# Patient Record
Sex: Male | Born: 1995 | Race: White | Hispanic: No | Marital: Single | Smoking: Never smoker
Health system: Southern US, Community
[De-identification: ages and names within clinical notes are randomized; demographics above are authoritative.]

## PROBLEM LIST (undated history)

## (undated) DIAGNOSIS — E8881 Metabolic syndrome: Secondary | ICD-10-CM

## (undated) DIAGNOSIS — E669 Obesity, unspecified: Secondary | ICD-10-CM

## (undated) DIAGNOSIS — F329 Major depressive disorder, single episode, unspecified: Secondary | ICD-10-CM

## (undated) DIAGNOSIS — F32A Depression, unspecified: Secondary | ICD-10-CM

## (undated) DIAGNOSIS — J45909 Unspecified asthma, uncomplicated: Secondary | ICD-10-CM

## (undated) DIAGNOSIS — H6593 Unspecified nonsuppurative otitis media, bilateral: Secondary | ICD-10-CM

## (undated) DIAGNOSIS — K219 Gastro-esophageal reflux disease without esophagitis: Secondary | ICD-10-CM

## (undated) DIAGNOSIS — L0591 Pilonidal cyst without abscess: Secondary | ICD-10-CM

## (undated) DIAGNOSIS — K760 Fatty (change of) liver, not elsewhere classified: Secondary | ICD-10-CM

## (undated) HISTORY — DX: Obesity, unspecified: E66.9

## (undated) HISTORY — DX: Major depressive disorder, single episode, unspecified: F32.9

## (undated) HISTORY — PX: MIDDLE EAR SURGERY: SHX713

## (undated) HISTORY — DX: Depression, unspecified: F32.A

## (undated) HISTORY — DX: Unspecified nonsuppurative otitis media, bilateral: H65.93

## (undated) HISTORY — DX: Gastro-esophageal reflux disease without esophagitis: K21.9

## (undated) HISTORY — PX: TYMPANOSTOMY TUBE PLACEMENT: SHX32

## (undated) HISTORY — DX: Pilonidal cyst without abscess: L05.91

## (undated) HISTORY — PX: TONSILECTOMY, ADENOIDECTOMY, BILATERAL MYRINGOTOMY AND TUBES: SHX2538

---
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
## 2017-09-01 ENCOUNTER — Encounter: Payer: Self-pay | Admitting: Emergency Medicine

## 2017-09-01 ENCOUNTER — Other Ambulatory Visit: Payer: Self-pay

## 2017-09-01 ENCOUNTER — Emergency Department
Admission: EM | Admit: 2017-09-01 | Discharge: 2017-09-01 | Disposition: A | Discharge status: Home / Self Care | Payer: Medicaid Other | Attending: Emergency Medicine | Admitting: Emergency Medicine

## 2017-09-01 DIAGNOSIS — H6690 Otitis media, unspecified, unspecified ear: Secondary | ICD-10-CM | POA: Insufficient documentation

## 2017-09-01 DIAGNOSIS — Z79899 Other long term (current) drug therapy: Secondary | ICD-10-CM | POA: Insufficient documentation

## 2017-09-01 DIAGNOSIS — J45909 Unspecified asthma, uncomplicated: Secondary | ICD-10-CM | POA: Diagnosis not present

## 2017-09-01 DIAGNOSIS — H9202 Otalgia, left ear: Secondary | ICD-10-CM | POA: Diagnosis present

## 2017-09-01 MED ORDER — AMOXICILLIN-POT CLAVULANATE 875-125 MG PO TABS: 1.0000 | ORAL_TABLET | Freq: Two times a day (BID) | ORAL | 0 refills | Status: AC

## 2017-09-01 MED ORDER — AMOXICILLIN-POT CLAVULANATE 875-125 MG PO TABS
1.0000 | ORAL_TABLET | Freq: Once | ORAL | Status: AC
  Administered 2017-09-01: 1 via ORAL
  Filled 2017-09-01: qty 1

## 2017-09-01 NOTE — ED Notes (Signed)
See triage note  Here for left ear pain and he also noticed some drainage from ear

## 2017-09-01 NOTE — ED Triage Notes (Addendum)
Pt states he has noticed left ear pain and bleeding in the ear since yesterday. Pt states he feels like his ears are full of fluid as well.   Pt states since birth he has had chronic otitis media. Pt states he has tubes in ears and is afraid one fell out.  Pt has ENT- [place]. [place].

## 2017-09-01 NOTE — ED Provider Notes (Signed)
[facility] Emergency Department Provider Note  ____________________________________________  Time seen: Approximately 2:59 PM  I have reviewed the triage vital signs and the nursing notes.   HISTORY  Chief Complaint Otalgia    HPI Daniel Freeman is a 22 y.o. male the presents emergency department for evaluation of left ear pain since yesterday with bloody drainage since this morning.  It feels like he has an ear infection.  No trauma. Patient states that he has had chronic bilateral otitis media since he was born.  He has had tubes in his ear since he was born.  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  He sees Dr. Elenore RotaJuengel.  No fever, chills.   Past Medical History:  Diagnosis Date  . Asthma   . Bilateral nonsuppurative otitis media   . Depression   . GERD (gastroesophageal reflux disease)   . Obesity   . Pilonidal cyst 2015    Patient Active Problem List   Diagnosis Date Noted  . Healthcare maintenance 06/13/2016  . Low HDL (under 40) 03/31/2016  . Metabolic syndrome 03/31/2016  . Fatty liver 09/10/2015  . Perennial allergic rhinitis 09/10/2015  . Seborrhea capitis 11/18/2014  . Recurrent otitis media of both ears 09/10/2014  . Gastroesophageal reflux disease without esophagitis 09/10/2014  . Childhood obesity 09/08/2009    Past Surgical History:  Procedure Laterality Date  . MIDDLE EAR SURGERY     tubes in ears x 5  . TONSILECTOMY, ADENOIDECTOMY, BILATERAL MYRINGOTOMY AND TUBES    . TYMPANOSTOMY TUBE PLACEMENT      Prior to Admission medications   Medication Sig Start Date End Date Taking? Authorizing Provider  amoxicillin-clavulanate (AUGMENTIN) 875-125 MG tablet Take 1 tablet by mouth 2 (two) times daily for 10 days. 09/01/17 09/11/17  Enid DerryWagner, Allisha Harter, PA-C  citalopram (CELEXA) 20 MG tablet Take 1 tablet (20 mg total) by mouth daily. 10/02/16   Sowles, Danna HeftyKrichna, MD  fluticasone (FLONASE) 50 MCG/ACT nasal spray PLACE 2 SPRAYS INTO BOTH  NOSTRILS DAILY. 10/02/16   Alba CorySowles, Krichna, MD  ranitidine (ZANTAC) 150 MG tablet Take 1 tablet (150 mg total) by mouth 2 (two) times daily. 10/02/16   Alba CorySowles, Krichna, MD  Selenium Sulfide 2.25 % SHAM Apply 1 g topically 3 (three) times a week. 03/31/16   Alba CorySowles, Krichna, MD    Allergies Vyvanse [lisdexamfetamine dimesylate]  Family History  Problem Relation Age of Onset  . Asthma Mother   . Hyperlipidemia Mother   . Hypertension Mother   . Diabetes Mother   . Obesity Mother   . Asthma Sister   . Speech disorder Sister   . Eczema Sister   . Asthma Brother   . Asthma Sister     Social History Social History   Tobacco Use  . Smoking status: Never Smoker  . Smokeless tobacco: Never Used  Substance Use Topics  . Alcohol use: No    Alcohol/week: 0.0 standard drinks  . Drug use: No     Review of Systems  Constitutional: No fever/chills Cardiovascular: No chest pain. Respiratory: No SOB. Gastrointestinal: No nausea, no vomiting.  Musculoskeletal: Negative for musculoskeletal pain. Skin: Negative for rash, abrasions, lacerations, ecchymosis. Neurological: Negative for numbness or tingling   ____________________________________________   PHYSICAL EXAM:  VITAL SIGNS: ED Triage Vitals  Enc Vitals Group     BP 09/01/17 1219 (!) 152/87     Pulse Rate 09/01/17 1219 95     Resp 09/01/17 1219 18     Temp 09/01/17 1219 98.1  F (36.7 C)     Temp Source 09/01/17 1219 Oral     SpO2 09/01/17 1219 97 %     Weight 09/01/17 1219 290 lb (131.5 kg)     Height 09/01/17 1219 5\' 7"  (1.702 m)     Head Circumference --      Peak Flow --      Pain Score 09/01/17 1220 7     Pain Loc --      Pain Edu? --      Excl. in GC? --      Constitutional: Alert and oriented. Well appearing and in no acute distress. Eyes: Conjunctivae are normal. PERRL. EOMI. Head: Atraumatic. ENT:      Ears: Right tympanic membrane is pearly.  Left tympanic membrane is erythematous.  Bloody drainage in  left ear canal.      Nose: No congestion/rhinnorhea.      Mouth/Throat: Mucous membranes are moist.  Neck: No stridor.   Cardiovascular: Normal rate, regular rhythm.  Good peripheral circulation. Respiratory: Normal respiratory effort without tachypnea or retractions. Lungs CTAB. Good air entry to the bases with no decreased or absent breath sounds. Musculoskeletal: Full range of motion to all extremities. No gross deformities appreciated. Neurologic:  Normal speech and language. No gross focal neurologic deficits are appreciated.  Skin:  Skin is warm, dry and intact. No rash noted. Psychiatric: Mood and affect are normal. Speech and behavior are normal. Patient exhibits appropriate insight and judgement.   ____________________________________________   LABS (all labs ordered are listed, but only abnormal results are displayed)  Labs Reviewed - No data to display ____________________________________________  EKG   ____________________________________________  RADIOLOGY   No results found.  ____________________________________________    PROCEDURES  Procedure(s) performed:    Procedures    Medications  amoxicillin-clavulanate (AUGMENTIN) 875-125 MG per tablet 1 tablet (1 tablet Oral Given 09/01/17 1316)     ____________________________________________   INITIAL IMPRESSION / ASSESSMENT AND PLAN / ED COURSE  Pertinent labs & imaging results that were available during my care of the patient were reviewed by me and considered in my medical decision making (see chart for details).  Review of the [place] CSRS was performed in accordance of the [facility] prior to dispensing any controlled drugs.     Patient's diagnosis is consistent with otitis media.There is blood in the ear canal. I do not visualize a tympanic membrane rupture. Patient will be discharged home with prescriptions for Augmentin. Patient is to follow up with ENT next week. Patient is given ED precautions to  return to the ED for any worsening or new symptoms.     ____________________________________________  FINAL CLINICAL IMPRESSION(S) / ED DIAGNOSES  Final diagnoses:  Acute otitis media, unspecified otitis media type      NEW MEDICATIONS STARTED DURING THIS VISIT:  ED Discharge Orders         Ordered    amoxicillin-clavulanate (AUGMENTIN) 875-125 MG tablet  2 times daily     09/01/17 1304              This chart was dictated using voice recognition software/Dragon. Despite best efforts to proofread, errors can occur which can change the meaning. Any change was purely unintentional.    Enid Derry, PA-C 09/01/17 1534    Arnaldo Natal, MD 09/01/17 1630

## 2017-09-03 ENCOUNTER — Telehealth: Payer: Self-pay

## 2017-09-03 NOTE — Telephone Encounter (Signed)
Copied from CRM [id number]. Topic: Referral - Request >> Sep 03, 2017  3:01 PM Rica KoyanagiWeikart, Barbee CoughMelissa J wrote: Reason for CRM: pt needs to have referral to Dr [place]ore [place]Juengel [place]. Pt was taken to ed on 09/01/17 Pt has appt this thurs

## 2017-09-04 ENCOUNTER — Ambulatory Visit (INDEPENDENT_AMBULATORY_CARE_PROVIDER_SITE_OTHER): Payer: Medicaid Other | Admitting: Family Medicine

## 2017-09-04 ENCOUNTER — Encounter: Payer: Self-pay | Admitting: Family Medicine

## 2017-09-04 VITALS — BP 138/76 | HR 96 | Temp 98.3°F | Resp 16 | Ht 65.0 in | Wt 291.3 lb

## 2017-09-04 DIAGNOSIS — E786 Lipoprotein deficiency: Secondary | ICD-10-CM

## 2017-09-04 DIAGNOSIS — E8881 Metabolic syndrome: Secondary | ICD-10-CM

## 2017-09-04 DIAGNOSIS — K760 Fatty (change of) liver, not elsewhere classified: Secondary | ICD-10-CM

## 2017-09-04 DIAGNOSIS — F411 Generalized anxiety disorder: Secondary | ICD-10-CM

## 2017-09-04 DIAGNOSIS — H66005 Acute suppurative otitis media without spontaneous rupture of ear drum, recurrent, left ear: Secondary | ICD-10-CM

## 2017-09-04 DIAGNOSIS — K219 Gastro-esophageal reflux disease without esophagitis: Secondary | ICD-10-CM

## 2017-09-04 DIAGNOSIS — L210 Seborrhea capitis: Secondary | ICD-10-CM

## 2017-09-04 MED ORDER — RANITIDINE HCL 150 MG PO TABS: 150.0000 mg | ORAL_TABLET | Freq: Two times a day (BID) | ORAL | 2 refills | Status: DC

## 2017-09-04 MED ORDER — SELENIUM SULFIDE 2.25 % EX SHAM: 1.0000 g | MEDICATED_SHAMPOO | CUTANEOUS | 2 refills | Status: DC

## 2017-09-04 NOTE — Progress Notes (Signed)
Name: Daniel Freeman   MRN: [id number]    DOB: 06/14/1995   Date:09/04/2017       Progress Note  Subjective  Chief Complaint  Chief Complaint  Patient presents with  . Otitis Media    left ear pain. patient went to the ER over the weekend and was given ATB  . Referral    [facility] ([place] [facility] [place])    HPI  OM: he has history of recurrent OM and had severe symptoms over this past weekend. He sates woke up with blood all over his pillow [facility] He states that pain and drainage has decreased since started on Augmentin. Pain at this time on left ear is dull and 4/10 now.   Morbid obesity: he has been more active, working part time at [employer] as a lot associated. 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   Metabolic syndrome: last hgbA1C was normal. Denies polyphagia, polydipsia or polyuria.   GAD: he states he is doing much better now, off medication, states only took a few pills last year.   Dyslipidemia: low HDL. He states he has been more active, but his diet is slightly better, drinking mostly water and sometimes Gatorade, he still eats fast food when at work   Patient Active Problem List   Diagnosis Date Noted  . Morbid obesity due to excess calories (HCC) 09/04/2017  . Healthcare maintenance 06/13/2016  . Low HDL (under 40) 03/31/2016  . Metabolic syndrome 03/31/2016  . Fatty liver 09/10/2015  . Perennial allergic rhinitis 09/10/2015  . Seborrhea capitis 11/18/2014  . Recurrent otitis media of both ears 09/10/2014  . Gastroesophageal reflux disease without esophagitis 09/10/2014  . Childhood obesity 09/08/2009    Past Surgical History:  Procedure Laterality Date  . MIDDLE EAR SURGERY     tubes in ears x 5  . TONSILECTOMY, ADENOIDECTOMY, BILATERAL MYRINGOTOMY AND TUBES    . TYMPANOSTOMY TUBE PLACEMENT      Family History  Problem Relation Age of Onset  . Asthma Mother   . Hyperlipidemia Mother   . Hypertension Mother   . Diabetes Mother   .  Obesity Mother   . Asthma Sister   . Speech disorder Sister   . Eczema Sister   . Asthma Brother   . Asthma Sister     Social History   Socioeconomic History  . Marital status: Single    Spouse name: Not on file  . Number of children: 0  . Years of education: Not on file  . Highest education level: 11th grade  Occupational History  . Not on file  Social Needs  . Financial resource strain: Somewhat hard  . Food insecurity:    Worry: Patient refused    Inability: Patient refused  . Transportation needs:    Medical: No    Non-medical: No  Tobacco Use  . Smoking status: Never Smoker  . Smokeless tobacco: Never Used  Substance and Sexual Activity  . Alcohol use: No    Alcohol/week: 0.0 standard drinks  . Drug use: No  . Sexual activity: Not Currently  Lifestyle  . Physical activity:    Days per week: 4 days    Minutes per session: 150+ min  . Stress: Only a little  Relationships  . Social connections:    Talks on phone: More than three times a week    Gets together: Twice a week    Attends religious service: Never    Active member  of club or organization: No    Attends meetings of clubs or organizations: Never    Relationship status: Never married  . Intimate partner violence:    Fear of current or ex partner: No    Emotionally abused: No    Physically abused: No    Forced sexual activity: No  Other Topics Concern  . Not on file  Social History Narrative  . Not on file     Current Outpatient Medications:  .  amoxicillin-clavulanate (AUGMENTIN) 875-125 MG tablet, Take 1 tablet by mouth 2 (two) times daily for 10 days., Disp: 20 tablet, Rfl: 0 .  fluticasone (FLONASE) 50 MCG/ACT nasal spray, PLACE 2 SPRAYS INTO BOTH NOSTRILS DAILY., Disp: 16 g, Rfl: 2 .  ranitidine (ZANTAC) 150 MG tablet, Take 1 tablet (150 mg total) by mouth 2 (two) times daily., Disp: 60 tablet, Rfl: 2 .  [START ON 09/05/2017] Selenium Sulfide 2.25 % SHAM, Apply 1 g topically 3 (three) times a  week., Disp: 180 mL, Rfl: 2  Allergies  Allergen Reactions  . Vyvanse [Lisdexamfetamine Dimesylate] Other (See Comments)    Emotional liability     ROS  Constitutional: Negative for fever or weight change.  Respiratory: Negative for cough and shortness of breath.   Cardiovascular: Negative for chest pain or palpitations.  Gastrointestinal: Negative for abdominal pain, no bowel changes.  Musculoskeletal: Negative for gait problem or joint swelling.  Skin: Negative for rash.  Neurological: Negative for dizziness or headache.  No other specific complaints in a complete review of systems (except as listed in HPI above).  Objective  Vitals:   09/04/17 1004  BP: 138/76  Pulse: 96  Resp: 16  Temp: 98.3 F (36.8 C)  TempSrc: Oral  SpO2: 97%  Weight: 291 lb 4.8 oz (132.1 kg)  Height: 5\' 5"  (1.651 m)    Body mass index is 48.47 kg/m.  Physical Exam  Constitutional: Patient appears well-developed and well-nourished. Obese  No distress.  HEENT: head atraumatic, normocephalic, pupils equal and reactive to light, ears right side has cerumen, left side has some drainage, tube in place , air fluid levels,  neck supple, throat within normal limits Cardiovascular: Normal rate, regular rhythm and normal heart sounds.  No murmur heard. No BLE edema. Pulmonary/Chest: Effort normal and breath sounds normal. No respiratory distress. Abdominal: Soft.  There is no tenderness. Psychiatric: Patient has a normal mood and affect. behavior is normal. Judgment and thought content normal.  PHQ2/9: Depression screen [facility] 2/9 09/04/2017 10/02/2016 03/31/2016 09/10/2015 03/25/2015  Decreased Interest 0 0 0 0 0  Down, Depressed, Hopeless 0 0 - 0 0  PHQ - 2 Score 0 0 0 0 0  Altered sleeping 0 - - - -  Tired, decreased energy 1 - - - -  Change in appetite 1 - - - -  Feeling bad or failure about yourself  0 - - - -  Trouble concentrating 0 - - - -  Moving slowly or fidgety/restless 0 - - - -  Suicidal  thoughts 0 - - - -  PHQ-9 Score 2 - - - -  Difficult doing work/chores Not difficult at all - - - -     Fall Risk: Fall Risk  09/04/2017 10/02/2016 03/31/2016 09/10/2015 03/25/2015  Falls in the past year? No No No No No    Functional Status Survey: Is the patient deaf or have difficulty hearing?: Yes Does the patient have difficulty seeing, even when wearing glasses/contacts?: No Does the patient have  difficulty concentrating, remembering, or making decisions?: No Does the patient have difficulty walking or climbing stairs?: No Does the patient have difficulty dressing or bathing?: No Does the patient have difficulty doing errands alone such as visiting a doctor's office or shopping?: No    Assessment & Plan  1. Recurrent acute suppurative otitis media of left ear  - Ambulatory referral to ENT  2. Gastroesophageal reflux disease without esophagitis  - ranitidine (ZANTAC) 150 MG tablet; Take 1 tablet (150 mg total) by mouth 2 (two) times daily.  Dispense: 60 tablet; Refill: 2  3. Morbid obesity due to excess calories [facility](HCC)  Discussed with the patient the risk posed by an increased BMI. Discussed importance of portion control, calorie counting and at least 150 minutes of physical activity weekly. Avoid sweet beverages and drink more water. Eat at least 6 servings of fruit and vegetables daily   4. Metabolic syndrome  -hgbA1C  5. GAD (generalized anxiety disorder)  Doing well at this time, stopped medication on his own   6. Fatty liver  -comp panel   7. Low HDL (under 40)  -lipid panel   8. Seborrhea capitis  - Selenium Sulfide 2.25 % SHAM; Apply 1 g topically 3 (three) times a week.  Dispense: 180 mL; Refill: 2

## 2017-09-04 NOTE — Telephone Encounter (Signed)
He needs to be seen, last visit 08/2017 Needs labs and OM with referral to be placed

## 2017-10-04 NOTE — Discharge Instructions (Signed)
[facility] °[facility] °ENDOSCOPIC SINUS SURGERY °[facility] ° °What is Functional Endoscopic Sinus Surgery? ° The Surgery involves making the natural openings of the sinuses larger by removing the bony partitions that separate the sinuses from the nasal cavity.  The natural sinus lining is preserved as much as possible to allow the sinuses to resume normal function after the surgery.  In some patients nasal polyps (excessively swollen lining of the sinuses) may be removed to relieve obstruction of the sinus openings.  The surgery is performed through the nose using lighted scopes, which eliminates the need for incisions on the face.  A septoplasty is a different procedure which is sometimes performed with sinus surgery.  It involves straightening the boy partition that separates the two sides of your nose.  A crooked or deviated septum may need repair if is obstructing the sinuses or nasal airflow.  Turbinate reduction is also often performed during sinus surgery.  The turbinates are bony proturberances from the side walls of the nose which swell and can obstruct the nose in patients with sinus and allergy problems.  Their size can be surgically reduced to help relieve nasal obstruction. ° °What Can Sinus Surgery Do For Me? ° Sinus surgery can reduce the frequency of sinus infections requiring antibiotic treatment.  This can provide improvement in nasal congestion, post-nasal drainage, facial pressure and nasal obstruction.  Surgery will NOT prevent you from ever having an infection again, so it usually only for patients who get infections 4 or more times yearly requiring antibiotics, or for infections that do not clear with antibiotics.  It will not cure nasal allergies, so patients with allergies may still require medication to treat their allergies after surgery. Surgery may improve headaches related to sinusitis, however, some people will continue to  require medication to control sinus headaches related to allergies.  Surgery will do nothing for other forms of headache (migraine, tension or cluster). ° °What Are the Risks of Endoscopic Sinus Surgery? ° Current techniques allow surgery to be performed safely with little risk, however, there are rare complications that patients should be aware of.  Because the sinuses are located around the eyes, there is risk of eye injury, including blindness, though again, this would be quite rare. This is usually a result of bleeding behind the eye during surgery, which puts the vision oat risk, though there are treatments to protect the vision and prevent permanent disrupted by surgery causing a leak of the spinal fluid that surrounds the brain.  More serious complications would include bleeding inside the brain cavity or damage to the brain.  Again, all of these complications are uncommon, and spinal fluid leaks can be safely managed surgically if they occur.  The most common complication of sinus surgery is bleeding from the nose, which may require packing or cauterization of the nose.  Continued sinus have polyps may experience recurrence of the polyps requiring revision surgery.  Alterations of sense of smell or injury to the tear ducts are also rare complications.  ° °What is the Surgery Like, and what is the Recovery? ° The Surgery usually takes a couple of hours to perform, and is usually performed under a general anesthetic (completely asleep).  Patients are usually discharged home after a couple of hours.  Sometimes during surgery it is necessary to pack the nose to control bleeding, and the packing is left in place for 24 - 48 hours, and removed by your surgeon.    If a septoplasty was performed during the procedure, there is often a splint placed which must be removed after 5-7 days.   °Discomfort: Pain is usually mild to moderate, and can be controlled by prescription pain medication or acetaminophen (Tylenol).   Aspirin, Ibuprofen (Advil, Motrin), or Naprosyn (Aleve) should be avoided, as they can cause increased bleeding.  Most patients feel sinus pressure like they have a bad head cold for several days.  Sleeping with your head elevated can help reduce swelling and facial pressure, as can ice packs over the face.  A humidifier may be helpful to keep the mucous and blood from drying in the nose.  ° °Diet: There are no specific diet restrictions, however, you should generally start with clear liquids and a light diet of bland foods because the anesthetic can cause some nausea.  Advance your diet depending on how your stomach feels.  Taking your pain medication with food will often help reduce stomach upset which pain medications can cause. ° °Nasal Saline Irrigation: It is important to remove blood clots and dried mucous from the nose as it is healing.  This is done by having you irrigate the nose at least 3 - 4 times daily with a salt water solution.  We recommend using NeilMed Sinus Rinse (available at the drug store).  Fill the squeeze bottle with the solution, bend over a sink, and insert the tip of the squeeze bottle into the nose ½ of an inch.  Point the tip of the squeeze bottle towards the inside corner of the eye on the same side your irrigating.  Squeeze the bottle and gently irrigate the nose.  If you bend forward as you do this, most of the fluid will flow back out of the nose, instead of down your throat.   The solution should be warm, near body temperature, when you irrigate.   Each time you irrigate, you should use a full squeeze bottle.  ° °Note that if you are instructed to use Nasal Steroid Sprays at any time after your surgery, irrigate with saline BEFORE using the steroid spray, so you do not wash it all out of the nose. °Another product, Nasal Saline Gel (such as AYR Nasal Saline Gel) can be applied in each nostril 3 - 4 times daily to moisture the nose and reduce scabbing or crusting. ° °Bleeding:   Bloody drainage from the nose can be expected for several days, and patients are instructed to irrigate their nose frequently with salt water to help remove mucous and blood clots.  The drainage may be dark red or brown, though some fresh blood may be seen intermittently, especially after irrigation.  Do not blow you nose, as bleeding may occur. If you must sneeze, keep your mouth open to allow air to escape through your mouth. ° °If heavy bleeding occurs: Irrigate the nose with saline to rinse out clots, then spray the nose 3 - 4 times with Afrin Nasal Decongestant Spray.  The spray will constrict the blood vessels to slow bleeding.  Pinch the lower half of your nose shut to apply pressure, and lay down with your head elevated.  Ice packs over the nose may help as well. If bleeding persists despite these measures, you should notify your doctor.  Do not use the Afrin routinely to control nasal congestion after surgery, as it can result in worsening congestion and may affect healing.  ° ° ° °Activity: Return to work varies among patients. Most patients will be   out of work at least 5 - 7 days to recover.  Patient may return to work after they are off of narcotic pain medication, and feeling well enough to perform the functions of their job.  Patients must avoid heavy lifting (over 10 pounds) or strenuous physical for 2 weeks after surgery, so your employer may need to assign you to light duty, or keep you out of work longer if light duty is not possible.  NOTE: you should not drive, operate dangerous machinery, do any mentally demanding tasks or make any important legal or financial decisions while on narcotic pain medication and recovering from the general anesthetic.  °  °Call Your Doctor Immediately if You Have Any of the Following: °1. Bleeding that you cannot control with the above measures °2. Loss of vision, double vision, bulging of the eye or black eyes. °3. Fever over 101 degrees °4. Neck stiffness with  severe headache, fever, nausea and change in mental state. °You are always encourage to call anytime with concerns, however, please call with requests for pain medication refills during office hours. ° °Office Endoscopy: During follow-up visits your doctor will remove any packing or splints that may have been placed and evaluate and clean your sinuses endoscopically.  Topical anesthetic will be used to make this as comfortable as possible, though you may want to take your pain medication prior to the visit.  How often this will need to be done varies from patient to patient.  After complete recovery from the surgery, you may need follow-up endoscopy from time to time, particularly if there is concern of recurrent infection or nasal polyps. ° ° °General Anesthesia, Adult, Care After °These instructions provide you with information about caring for yourself after your procedure. Your health care provider may also give you more specific instructions. Your treatment has been planned according to current medical practices, but problems sometimes occur. Call your health care provider if you have any problems or questions after your procedure. °What can I expect after the procedure? °After the procedure, it is common to have: °· Vomiting. °· A sore throat. °· Mental slowness. ° °It is common to feel: °· Nauseous. °· Cold or shivery. °· Sleepy. °· Tired. °· Sore or achy, even in parts of your body where you did not have surgery. ° °Follow these instructions at home: °For at least 24 hours after the procedure: °· Do not: °? Participate in activities where you could fall or become injured. °? Drive. °? Use heavy machinery. °? Drink alcohol. °? Take sleeping pills or medicines that cause drowsiness. °? Make important decisions or sign legal documents. °? Take care of children on your own. °· Rest. °Eating and drinking °· If you vomit, drink water, juice, or soup when you can drink without vomiting. °· Drink enough fluid to  keep your urine clear or pale yellow. °· Make sure you have little or no nausea before eating solid foods. °· Follow the diet recommended by your health care provider. °General instructions °· Have a responsible adult stay with you until you are awake and alert. °· Return to your normal activities as told by your health care provider. Ask your health care provider what activities are safe for you. °· Take over-the-counter and prescription medicines only as told by your health care provider. °· If you smoke, do not smoke without supervision. °· Keep all follow-up visits as told by your health care provider. This is important. °Contact a health care provider if: °· You   continue to have nausea or vomiting at home, and medicines are not helpful. °· You cannot drink fluids or start eating again. °· You cannot urinate after 8-12 hours. °· You develop a skin rash. °· You have fever. °· You have increasing redness at the site of your procedure. °Get help right away if: °· You have difficulty breathing. °· You have chest pain. °· You have unexpected bleeding. °· You feel that you are having a life-threatening or urgent problem. °This information is not intended to replace advice given to you by your health care provider. Make sure you discuss any questions you have with your health care provider. °Document Released: 04/17/2000 Document Revised: 06/14/2015 Document Reviewed: 12/24/2014 °Elsevier Interactive Patient Education © 2018 [employer] ° °

## 2017-10-11 ENCOUNTER — Encounter
Admission: RE | Disposition: A | Discharge status: Home / Self Care | Payer: Self-pay | Source: Ambulatory Visit | Attending: Otolaryngology

## 2017-10-11 ENCOUNTER — Ambulatory Visit: Payer: Medicaid Other | Admitting: Anesthesiology

## 2017-10-11 ENCOUNTER — Ambulatory Visit
Admission: RE | Admit: 2017-10-11 | Discharge: 2017-10-11 | Disposition: A | Discharge status: Home / Self Care | Payer: Medicaid Other | Source: Ambulatory Visit | Attending: Otolaryngology | Admitting: Otolaryngology

## 2017-10-11 DIAGNOSIS — F329 Major depressive disorder, single episode, unspecified: Secondary | ICD-10-CM | POA: Diagnosis not present

## 2017-10-11 DIAGNOSIS — J45909 Unspecified asthma, uncomplicated: Secondary | ICD-10-CM | POA: Diagnosis not present

## 2017-10-11 DIAGNOSIS — Z79899 Other long term (current) drug therapy: Secondary | ICD-10-CM | POA: Diagnosis not present

## 2017-10-11 DIAGNOSIS — K219 Gastro-esophageal reflux disease without esophagitis: Secondary | ICD-10-CM | POA: Insufficient documentation

## 2017-10-11 DIAGNOSIS — H6610 Chronic tubotympanic suppurative otitis media, unspecified: Secondary | ICD-10-CM | POA: Diagnosis not present

## 2017-10-11 DIAGNOSIS — J320 Chronic maxillary sinusitis: Secondary | ICD-10-CM | POA: Insufficient documentation

## 2017-10-11 DIAGNOSIS — J3489 Other specified disorders of nose and nasal sinuses: Secondary | ICD-10-CM | POA: Insufficient documentation

## 2017-10-11 DIAGNOSIS — H6990 Unspecified Eustachian tube disorder, unspecified ear: Secondary | ICD-10-CM | POA: Diagnosis not present

## 2017-10-11 DIAGNOSIS — J339 Nasal polyp, unspecified: Secondary | ICD-10-CM | POA: Insufficient documentation

## 2017-10-11 HISTORY — DX: Metabolic syndrome: E88.81

## 2017-10-11 HISTORY — PX: MAXILLARY ANTROSTOMY: SHX2003

## 2017-10-11 HISTORY — DX: Metabolic syndrome: E88.810

## 2017-10-11 HISTORY — DX: Fatty (change of) liver, not elsewhere classified: K76.0

## 2017-10-11 SURGERY — MAXILLARY ANTROSTOMY
Anesthesia: General | Site: Nose | Laterality: Bilateral

## 2017-10-11 MED ORDER — OXYMETAZOLINE HCL 0.05 % NA SOLN
2.0000 | Freq: Once | NASAL | Status: AC
  Administered 2017-10-11: 2 via NASAL

## 2017-10-11 MED ORDER — PROPOFOL 10 MG/ML IV BOLUS
INTRAVENOUS | Status: DC | PRN
  Administered 2017-10-11: 170 mg via INTRAVENOUS

## 2017-10-11 MED ORDER — DEXTROSE 5 % IV SOLN
2000.0000 mg | Freq: Once | INTRAVENOUS | Status: AC
  Administered 2017-10-11: 2000 mg via INTRAVENOUS

## 2017-10-11 MED ORDER — GLYCOPYRROLATE 0.2 MG/ML IJ SOLN
INTRAMUSCULAR | Status: DC | PRN
  Administered 2017-10-11: 0.1 mg via INTRAVENOUS

## 2017-10-11 MED ORDER — DEXAMETHASONE SODIUM PHOSPHATE 4 MG/ML IJ SOLN
INTRAMUSCULAR | Status: DC | PRN
  Administered 2017-10-11: 10 mg via INTRAVENOUS

## 2017-10-11 MED ORDER — FENTANYL CITRATE (PF) 100 MCG/2ML IJ SOLN: 25.0000 ug | INTRAMUSCULAR | Status: DC | PRN

## 2017-10-11 MED ORDER — PHENYLEPHRINE HCL 0.5 % NA SOLN
NASAL | Status: DC | PRN
  Administered 2017-10-11: 15 mL via TOPICAL

## 2017-10-11 MED ORDER — ROCURONIUM BROMIDE 100 MG/10ML IV SOLN
INTRAVENOUS | Status: DC | PRN
  Administered 2017-10-11: 25 mg via INTRAVENOUS

## 2017-10-11 MED ORDER — FENTANYL CITRATE (PF) 100 MCG/2ML IJ SOLN
INTRAMUSCULAR | Status: DC | PRN
  Administered 2017-10-11: 100 ug via INTRAVENOUS

## 2017-10-11 MED ORDER — DEXMEDETOMIDINE HCL 200 MCG/2ML IV SOLN
INTRAVENOUS | Status: DC | PRN
  Administered 2017-10-11: 10 ug via INTRAVENOUS

## 2017-10-11 MED ORDER — ACETAMINOPHEN 325 MG PO TABS
975.0000 mg | ORAL_TABLET | Freq: Once | ORAL | Status: AC
  Administered 2017-10-11: 975 mg via ORAL

## 2017-10-11 MED ORDER — OXYCODONE HCL 5 MG/5ML PO SOLN: 5.0000 mg | Freq: Once | ORAL | Status: AC | PRN

## 2017-10-11 MED ORDER — LIDOCAINE HCL (CARDIAC) PF 100 MG/5ML IV SOSY
PREFILLED_SYRINGE | INTRAVENOUS | Status: DC | PRN
  Administered 2017-10-11: 40 mg via INTRAVENOUS

## 2017-10-11 MED ORDER — LACTATED RINGERS IV SOLN
1000.0000 mL | INTRAVENOUS | Status: DC
  Administered 2017-10-11: 13:00:00 via INTRAVENOUS

## 2017-10-11 MED ORDER — OXYCODONE HCL 5 MG PO TABS
5.0000 mg | ORAL_TABLET | Freq: Once | ORAL | Status: AC | PRN
  Administered 2017-10-11: 5 mg via ORAL

## 2017-10-11 MED ORDER — MIDAZOLAM HCL 5 MG/5ML IJ SOLN
INTRAMUSCULAR | Status: DC | PRN
  Administered 2017-10-11: 2 mg via INTRAVENOUS

## 2017-10-11 MED ORDER — LIDOCAINE-EPINEPHRINE 1 %-1:100000 IJ SOLN
INTRAMUSCULAR | Status: DC | PRN
  Administered 2017-10-11: 2.5 mL

## 2017-10-11 MED ORDER — ONDANSETRON HCL 4 MG/2ML IJ SOLN
INTRAMUSCULAR | Status: DC | PRN
  Administered 2017-10-11: 4 mg via INTRAVENOUS

## 2017-10-11 SURGICAL SUPPLY — 27 items
BATTERY INSTRU NAVIGATION (MISCELLANEOUS) ×12 IMPLANT
CANISTER SUCT 1200ML W/VALVE (MISCELLANEOUS) ×4 IMPLANT
CATH IV 18X1 1/4 SAFELET (CATHETERS) ×4 IMPLANT
COAGULATOR SUCT 8FR VV (MISCELLANEOUS) ×4 IMPLANT
DRAPE HEAD BAR (DRAPES) ×4 IMPLANT
ELECT REM PT RETURN 9FT ADLT (ELECTROSURGICAL) ×4
ELECTRODE REM PT RTRN 9FT ADLT (ELECTROSURGICAL) ×2 IMPLANT
GLOVE PI ULTRA LF STRL 7.5 (GLOVE) ×4 IMPLANT
GLOVE PI ULTRA NON LATEX 7.5 (GLOVE) ×4
IV CATH 18X1 1/4 SAFELET (CATHETERS) ×2
IV NS 500ML (IV SOLUTION) ×2
IV NS 500ML BAXH (IV SOLUTION) ×2 IMPLANT
KIT TURNOVER KIT A (KITS) ×4 IMPLANT
NEEDLE ANESTHESIA  27G X 3.5 (NEEDLE) ×2
NEEDLE ANESTHESIA 27G X 3.5 (NEEDLE) ×2 IMPLANT
NS IRRIG 500ML POUR BTL (IV SOLUTION) ×4 IMPLANT
PACK ENT CUSTOM (PACKS) ×4 IMPLANT
PACKING NASAL EPIS 4X2.4 XEROG (MISCELLANEOUS) ×4 IMPLANT
PATTIES SURGICAL .5 X3 (DISPOSABLE) ×4 IMPLANT
SHAVER DIEGO BLD STD TYPE A (BLADE) ×4 IMPLANT
SOL ANTI-FOG 6CC FOG-OUT (MISCELLANEOUS) ×2 IMPLANT
SOL FOG-OUT ANTI-FOG 6CC (MISCELLANEOUS) ×2
SYR 3ML LL SCALE MARK (SYRINGE) ×4 IMPLANT
TOWEL OR 17X26 4PK STRL BLUE (TOWEL DISPOSABLE) ×4 IMPLANT
TRACKER CRANIALMASK (MASK) IMPLANT
TUBING DECLOG MULTIDEBRIDER (TUBING) ×4 IMPLANT
WATER STERILE IRR 250ML POUR (IV SOLUTION) ×4 IMPLANT

## 2017-10-11 NOTE — H&P (Signed)
H&P has been reviewedand patient reevaluated,  and no changes necessary. To be downloaded later.  

## 2017-10-11 NOTE — Anesthesia Procedure Notes (Signed)
Procedure Name: Intubation Date/Time: 10/11/2017 1:02 PM Performed by: Jimmy PicketAmyot, Annalynne Ibanez, CRNA Pre-anesthesia Checklist: Patient identified, Emergency Drugs available, Suction available, Patient being monitored and Timeout performed Patient Re-evaluated:Patient Re-evaluated prior to induction Oxygen Delivery Method: Circle system utilized Preoxygenation: Pre-oxygenation with 100% oxygen Induction Type: IV induction Ventilation: Mask ventilation without difficulty Laryngoscope Size: Miller and 3 Grade View: Grade I Tube type: Oral Rae Tube size: 7.5 mm Number of attempts: 1 Placement Confirmation: ETT inserted through vocal cords under direct vision,  positive ETCO2 and breath sounds checked- equal and bilateral Tube secured with: Tape Dental Injury: Teeth and Oropharynx as per pre-operative assessment

## 2017-10-11 NOTE — Op Note (Signed)
10/11/2017  2:12 PM    Daniel Freeman  [id number]   Pre-Op Dx: Chronic bilateral maxillary sinusitis, large left nasal polyp  Post-op Dx: Same  Proc: Left endoscopic maxillary antrostomy with removal of contents, right endoscopic maxillary antrostomy, endoscopic removal of left nasal polyp  Surg:  Daniel Freeman  Anes:  GOT  EBL: 50 mL  Comp: None  Findings: Large polyp filling the posterior nasal airway on the left side coming from the accessory ostium of the maxillary sinus.  No polyps on the right side.  When the left maxillary antrum was opened there is a large polyp at the base of the left maxillary sinus that seemed to be the source of the polyp hanging out in the nose.   Procedure: The patient was brought to the operating room and placed in a supine position.  General anesthesia was given by oral endotracheal intubation.  His nose was then prepped with cottonoid pledgets soaked in phenylephrine and 4% lidocaine.  These were allowed to sit body was prepped and draped in sterile fashion.  The image guided system was brought in to help use however the system was malfunctioning and could not be used today.  He was prepped and draped in sterile fashion.  The 0 degrees scopes were used on the left side.  You could see the the middle and inferior turbinate within normal position.  There is a very large watery polyp that was filling the entire posterior nasopharynx on the left side.  This had a stalk and appeared to be coming from the accessory ostium of the left maxillary sinus.  The stalk was grasped at the maxillary sinus opening and was pulled.  The entire polyp was removed along with some of the tissue that seem to come from inside the sinus.  This open up his airway significantly once the polyp was removed.  The middle turbinate was infractured to provide better access to the middle meatus.  The uncinate process was incised and was removed using side biters as well as the [facility]  microdebrider and through biting forceps.  Once this was removed he could see the natural ostium of the maxillary sinus was above and anterior to the accessory ostium.  This was all opened widely make one large maxillary antral opening.  It was widened posteriorly and inferiorly as well.  New  The 30 and 70 degrees scopes were used to visualize inside the maxillary sinus.  There is a polyp noted at the base the maxillary sinus with some remnants that the intracranial polyp was attached to.  I used the boss frontal sinus grasping forceps with a 90 degree angle to reach to the bottom of the maxillary sinus.  I was able to tear this polyp and marsupialize it.  There was then watery fluid in it but no sign of purulence.  Is able to remove many of the remnants of the polyp.  There is no significant bleeding.  This opened up the base of the maxillary sinus.  Cottonoid pledgets were placed here temporarily while the right side was addressed.  The 0 degrees scope was used to visualize the right side and the middle turbinate was infractured.  The uncinate process was incised and the uncinate was removed using the [facility] microdebrider along with the 0 and 30 degrees scopes the natural ostium was found away anterior to the accessory ostium.  This was opened widely with the [facility] microdebrider to make sure that the maxillary antrum could not block  over.  With a 30 and 70 degrees scope she could see the extent of the maxillary sinus and there is no evidence of any polyps at its base.  Mucous membranes around the natural ostium were fairly thickened but not acutely inflamed.  A cottonoid pledget was placed here temporarily and the left side was readdressed.  Left side was revisited and there is no significant bleeding anywhere.  The natural ostium was wide open.  Xerogel was placed at the opening to the maxillary sinus and in the middle meatus to hold the middle turbinate medialized.  The right side showed again no sign of  any significant bleeding the maxillary sinus open and clear.  Xerogel was placed in the middle meatus and at the opening the maxillary sinus.  Patient tolerated the procedure well.  He was awakened and taken to recovery room in satisfactory condition.  There were no operative complications.  Dispo:   To PACU to be discharged home  Plan: To follow-up in the office in 1 week for reevaluation of sinuses.  He will flush his nose 3-4 times a day starting tomorrow to help dissolve xerogel.  Will have him use a small prednisone taper and stay on antibiotics preventatively.  He is given Norco 5/325 number 30 pills for pain.  Daniel Freeman  10/11/2017 2:12 PM

## 2017-10-11 NOTE — Anesthesia Postprocedure Evaluation (Signed)
Anesthesia Post Note  Patient: Autumn PattyCody P Mccullum  Procedure(s) Performed: MAXILLARY ANTROSTOMYwith removal of tissue from sinus (Bilateral Nose)  Patient location during evaluation: PACU Anesthesia Type: General Level of consciousness: awake Pain management: pain level controlled Vital Signs Assessment: post-procedure vital signs reviewed and stable Respiratory status: spontaneous breathing Cardiovascular status: blood pressure returned to baseline Postop Assessment: no headache Anesthetic complications: no    Beckey DowningEric Shakirra Buehler

## 2017-10-11 NOTE — Transfer of Care (Signed)
Immediate Anesthesia Transfer of Care Note  Patient: Daniel Freeman  Procedure(s) Performed: MAXILLARY ANTROSTOMYwith removal of tissue from sinus (Bilateral Nose)  Patient Location: PACU  Anesthesia Type: General  Level of Consciousness: awake, alert  and patient cooperative  Airway and Oxygen Therapy: Patient Spontanous Breathing and Patient connected to supplemental oxygen  Post-op Assessment: Post-op Vital signs reviewed, Patient's Cardiovascular Status Stable, Respiratory Function Stable, Patent Airway and No signs of Nausea or vomiting  Post-op Vital Signs: Reviewed and stable  Complications: No apparent anesthesia complications

## 2017-10-11 NOTE — Anesthesia Preprocedure Evaluation (Signed)
Anesthesia Evaluation  Patient identified by MRN, date of birth, ID band Patient awake    Reviewed: Allergy & Precautions, NPO status , Patient's Chart, lab work & pertinent test results, reviewed documented beta blocker date and time   Airway Mallampati: I  TM Distance: >3 FB Neck ROM: Full   Comment: Beard Dental no notable dental hx.    Pulmonary asthma ,    Pulmonary exam normal breath sounds clear to auscultation       Cardiovascular negative cardio ROS Normal cardiovascular exam Rhythm:Regular Rate:Normal     Neuro/Psych PSYCHIATRIC DISORDERS Depression    GI/Hepatic Neg liver ROS, GERD  ,  Endo/Other  Morbid obesity  Renal/GU negative Renal ROS  negative genitourinary   Musculoskeletal negative musculoskeletal ROS (+)   Abdominal (+) + obese,   Peds negative pediatric ROS (+)  Hematology negative hematology ROS (+)   Anesthesia Other Findings   Reproductive/Obstetrics                             Anesthesia Physical Anesthesia Plan  ASA: III  Anesthesia Plan: General   Post-op Pain Management:    Induction: Intravenous  PONV Risk Score and Plan:   Airway Management Planned: Oral ETT  Additional Equipment: None  Intra-op Plan:   Post-operative Plan: Extubation in OR  Informed Consent: I have reviewed the patients History and Physical, chart, labs and discussed the procedure including the risks, benefits and alternatives for the proposed anesthesia with the patient or authorized representative who has indicated his/her understanding and acceptance.     Plan Discussed with: CRNA, Anesthesiologist and Surgeon  Anesthesia Plan Comments:         Anesthesia Quick Evaluation

## 2017-10-12 ENCOUNTER — Encounter: Payer: Self-pay | Admitting: Otolaryngology

## 2017-10-15 LAB — SURGICAL PATHOLOGY

## 2018-11-25 ENCOUNTER — Other Ambulatory Visit: Payer: Self-pay

## 2018-11-25 DIAGNOSIS — Z20822 Contact with and (suspected) exposure to covid-19: Secondary | ICD-10-CM

## 2018-11-25 NOTE — Progress Notes (Unsigned)
.  ls

## 2018-11-27 LAB — NOVEL CORONAVIRUS, NAA: SARS-CoV-2, NAA: NOT DETECTED

## 2019-12-09 ENCOUNTER — Other Ambulatory Visit: Payer: Self-pay

## 2019-12-09 ENCOUNTER — Encounter: Payer: Self-pay | Admitting: Internal Medicine

## 2019-12-09 ENCOUNTER — Ambulatory Visit (INDEPENDENT_AMBULATORY_CARE_PROVIDER_SITE_OTHER): Payer: Self-pay | Admitting: Internal Medicine

## 2019-12-09 VITALS — BP 140/68 | HR 97 | Temp 98.7°F | Resp 16 | Ht 65.0 in | Wt 329.9 lb

## 2019-12-09 DIAGNOSIS — H6690 Otitis media, unspecified, unspecified ear: Secondary | ICD-10-CM

## 2019-12-09 DIAGNOSIS — Z8659 Personal history of other mental and behavioral disorders: Secondary | ICD-10-CM

## 2019-12-09 MED ORDER — AMOXICILLIN 500 MG PO CAPS: 500.0000 mg | ORAL_CAPSULE | Freq: Three times a day (TID) | ORAL | 0 refills | Status: AC

## 2019-12-09 NOTE — Progress Notes (Signed)
Patient ID: Daniel Freeman, male    DOB: 1995/06/13, 24 y.o.   MRN: [id number]  PCP: Alba Cory, MD  Chief Complaint  Patient presents with  . Ear Pain    right ear pain.     Subjective:   Daniel Freeman is a 24 y.o. male, presents to clinic with CC of the following:  Chief Complaint  Patient presents with  . Ear Pain    right ear pain.     HPI:  Patient is a 24 year old male patient of Dr. Carlynn Purl Follows up today with right ear pain. He did have surgery in September 2019 by ENT for nasal polyps.   He noted the right ear pain has been going on for several weeks now, and he has a long history of hearing actions.  He notes occasionally he has some mucousy type discharge from the ear, at one point had some bloody discharge, although not in the very recent past.  He states he has had tubes placed in both ears in the past, butterfly tubes, and that he has not needed amoxicillin in the last couple years to manage any ear infections.  He notes the amoxicillin product typically works for him. He denies any fevers, no nasal congestion or postnasal drip, no sore throats, no cough. No left ear symptoms.  He also inquired about getting ADHD tested.  He was tested when he was very young, not since.  He does have some concerns presently with some symptoms, and was not sure of the next steps.    Patient Active Problem List   Diagnosis Date Noted  . Morbid obesity due to excess calories (HCC) 09/04/2017  . Healthcare maintenance 06/13/2016  . Low HDL (under 40) 03/31/2016  . Metabolic syndrome 03/31/2016  . Fatty liver 09/10/2015  . Perennial allergic rhinitis 09/10/2015  . Seborrhea capitis 11/18/2014  . Recurrent otitis media of both ears 09/10/2014  . Gastroesophageal reflux disease without esophagitis 09/10/2014  . Childhood obesity 09/08/2009     No current outpatient medications on file.   Allergies  Allergen Reactions  . Vyvanse [Lisdexamfetamine Dimesylate]  Other (See Comments)    Emotional liability     Past Surgical History:  Procedure Laterality Date  . MAXILLARY ANTROSTOMY Bilateral 10/11/2017   Procedure: MAXILLARY ANTROSTOMYwith removal of tissue from sinus;  Surgeon: Vernie Murders, MD;  Location: [facility];  Service: ENT;  Laterality: Bilateral;  . MIDDLE EAR SURGERY     tubes in ears x 5  . TONSILECTOMY, ADENOIDECTOMY, BILATERAL MYRINGOTOMY AND TUBES    . TYMPANOSTOMY TUBE PLACEMENT       Family History  Problem Relation Age of Onset  . Asthma Mother   . Hyperlipidemia Mother   . Hypertension Mother   . Diabetes Mother   . Obesity Mother   . Asthma Sister   . Speech disorder Sister   . Eczema Sister   . Asthma Brother   . Asthma Sister      Social History   Tobacco Use  . Smoking status: Never Smoker  . Smokeless tobacco: Never Used  Substance Use Topics  . Alcohol use: No    Alcohol/week: 0.0 standard drinks    With staff assistance, above reviewed with the patient today.  ROS: As per HPI, otherwise no specific complaints on a limited and focused system review   No results found for this or any previous visit (from the past 72 hour(s)).   PHQ2/9: Depression screen [facility] 2/9 12/09/2019  12/09/2019 09/04/2017 10/02/2016 03/31/2016  Decreased Interest 1 0 0 0 0  Down, Depressed, Hopeless 1 0 0 0 -  PHQ - 2 Score 2 0 0 0 0  Altered sleeping - - 0 - -  Tired, decreased energy - - 1 - -  Change in appetite - - 1 - -  Feeling bad or failure about yourself  - - 0 - -  Trouble concentrating - - 0 - -  Moving slowly or fidgety/restless - - 0 - -  Suicidal thoughts - - 0 - -  PHQ-9 Score - - 2 - -  Difficult doing work/chores - - Not difficult at all - -   PHQ-2/9 Result reviewed   Fall Risk: Fall Risk  12/09/2019 12/09/2019 09/04/2017 10/02/2016 03/31/2016  Falls in the past year? 0 0 No No No  Number falls in past yr: 0 0 - - -  Injury with Fall? 0 0 - - -      Objective:   Vitals:   12/09/19 0834   BP: 140/68  Pulse: 97  Resp: 16  Temp: 98.7 F (37.1 C)  TempSrc: Oral  SpO2: 99%  Weight: (!) 329 lb 14.4 oz (149.6 kg)  Height: 5\' 5"  (1.651 m)    Body mass index is 54.9 kg/m.  Physical Exam   NAD, masked, pleasant HEENT - [place]/AT, sclera anicteric, PERRL, EOMI, conj - non-inj'ed, the left TM and canal were clear, the right canal was clear with the TM appearing to have a tube in place, and marked erythema noted of the TM, no discharge noted, pharynx clear.  Not tender tugging on the lobes of the right ear. Neck - supple, no adenopathy including no subauricular adenopathy,  Car - RRR without m/g/r Pulm- RR and effort normal at rest, CTA without wheeze or rales Abd -obese Neuro/psychiatric - affect was not flat, appropriate with conversation  Alert with normal speech, not rapid   Results for orders placed or performed in visit on 11/25/18  Novel Coronavirus, NAA ([facility])   Specimen: Nasopharyngeal(NP) swabs in vial transport medium   NASOPHARYNGE  TESTING  Result Value Ref Range   SARS-CoV-2, NAA Not Detected Not Detected       Assessment & Plan:  1. Acute otitis media, unspecified otitis media type 2. Chronic otitis media, unspecified otitis media type Patient noted a long history of ear infections in his past, with tubes placed previously in both ears.  Has been having more symptoms again in the right ear, and do have concerns for a possible acute infection.  Noted in Augmentin product may be best with his history, although he was concerned with the potential cost, and preferred to just have amoxicillin as that has been helpful for him in the past. Prescribed amoxicillin-500 mg 3 times daily. Noted if symptoms not improving or more problematic, does need to follow-up, and likely would need a referral to ENT at that point to help with further management.  He was understanding of that.  - amoxicillin (AMOXIL) 500 MG capsule; Take 1 capsule (500 mg total) by mouth 3 (three)  times daily for 10 days.  Dispense: 30 capsule; Refill: 0   3. History of ADHD Discussed this with him at length today, and does need to have testing done to help with the diagnosis, as discussed concerns with the medications used to manage this, noting they are very controlled, and the importance of having an accurate diagnosis to consider starting medications. Noted some places in the  area that he can contact to potentially get this testing completed, and also noted one in [place] that may be an option as well.  Notes he does not have insurance, and concerned that cost may be an issue as well. Discussed potentially following up with his PCP, Dr. Carlynn Purl who has known him for a much longer period of time to further discuss over time pending the outcome of the above.  Also to follow-up if his ear infection symptoms are not improved or more problematic over time as we discussed today.     Jamelle Haring, MD 12/09/19 8:42 AM

## 2020-12-09 ENCOUNTER — Ambulatory Visit: Payer: Self-pay | Admitting: *Deleted

## 2020-12-09 NOTE — Telephone Encounter (Signed)
Right ear discomfort, possible infection. Seeking appt, nothing available soon. Please advise   Best contact: [phone]     Reason for Disposition . White, yellow, or green discharge  Answer Assessment - Initial Assessment Questions 1. LOCATION: "Which ear is involved?"     right 2. ONSET: "When did the ear start hurting"      1-2 weeks 3. SEVERITY: "How bad is the pain?"  (Scale 1-10; mild, moderate or severe)   - MILD (1-3): doesn't interfere with normal activities    - MODERATE (4-7): interferes with normal activities or awakens from sleep    - SEVERE (8-10): excruciating pain, unable to do any normal activities      4/10 4. URI SYMPTOMS: "Do you have a runny nose or cough?"     no 5. FEVER: "Do you have a fever?" If Yes, ask: "What is your temperature, how was it measured, and when did it start?"     no 6. CAUSE: "Have you been swimming recently?", "How often do you use Q-TIPS?", "Have you had any recent air travel or scuba diving?"    no 7. OTHER SYMPTOMS: "Do you have any other symptoms?" (e.g., headache, stiff neck, dizziness, vomiting, runny nose, decreased hearing)     Ear drainage, clear or yellowish at times  Protocols used: Earache-A-AH

## 2020-12-09 NOTE — Telephone Encounter (Signed)
Pt reports right earache. H/O otitis. Onset 1-2 weeks ago. Reports some clear drainage, yellowish at times. Denies fever. Rates at 4/10 "Not really pain, just a soreness, pressure." No availability within protocol timeframe. Pt questioning if antibiotic or drops be called in. Advised would probably need to be seen but would route to practice for PCPs review and final disposition. Care advise given  per protocol. Pt verbalizes understanding.   Please advise: [phone]

## 2020-12-10 NOTE — Telephone Encounter (Signed)
Lvm to sch appt. If pt has medicaid family planning he will be self pay

## 2020-12-13 ENCOUNTER — Ambulatory Visit: Payer: Self-pay | Admitting: Family Medicine

## 2020-12-13 ENCOUNTER — Encounter: Payer: Self-pay | Admitting: Family Medicine

## 2020-12-13 ENCOUNTER — Other Ambulatory Visit: Payer: Self-pay

## 2020-12-13 DIAGNOSIS — H6690 Otitis media, unspecified, unspecified ear: Secondary | ICD-10-CM | POA: Insufficient documentation

## 2020-12-13 MED ORDER — AMOXICILLIN 875 MG PO TABS: 875.0000 mg | ORAL_TABLET | Freq: Two times a day (BID) | ORAL | 0 refills | Status: AC

## 2020-12-13 NOTE — Progress Notes (Signed)
    SUBJECTIVE:   CHIEF COMPLAINT / HPI:   EAR PAIN Duration: months Involved ear(s): right  Quality:  sharp, dull Fever: no Otorrhea: yes Upper respiratory infection symptoms: no Pruritus: yes Hearing loss: yes Water immersion no Using Q-tips: no Recurrent otitis media: yes Treatments attempted: none   OBJECTIVE:   BP 118/72   Pulse 92   Temp 98.1 F (36.7 C) (Oral)   Resp 16   Ht 5\' 8"  (1.727 m)   Wt (!) 320 lb 11.2 oz (145.5 kg)   SpO2 98%   BMI 48.76 kg/m   Gen: well appearing, in NAD HEENT: L canal obstructed by cerumen. R Tm with purulence. Purulence in R canal with erythema.   ASSESSMENT/PLAN:   Recurrent AOM (acute otitis media) Rx amox x5 days. Recommend ENT f/u if no better given recurrent AOM and multiple previous surgeries.      , DO

## 2020-12-13 NOTE — Assessment & Plan Note (Addendum)
Rx amox x5 days. Recommend ENT f/u if no better given recurrent AOM and multiple previous surgeries.
